# Patient Record
Sex: Male | Born: 1970 | Race: Black or African American | Hispanic: No | Marital: Single | Smoking: Current every day smoker
Health system: Southern US, Community
[De-identification: ages and names within clinical notes are randomized; demographics above are authoritative.]

## PROBLEM LIST (undated history)

## (undated) DIAGNOSIS — I10 Essential (primary) hypertension: Secondary | ICD-10-CM

---

## 2006-07-06 ENCOUNTER — Emergency Department: Payer: Self-pay | Admitting: General Practice

## 2007-02-22 ENCOUNTER — Emergency Department: Payer: Self-pay | Admitting: Emergency Medicine

## 2008-06-05 IMAGING — CR DG KNEE COMPLETE 4+V*R*
1 series · 4 of 4 positions shown · non-contrast
Comparison: none

REASON FOR EXAM: injury minor care 1
COMMENTS:   LMP: (Male)

PROCEDURE:     DXR - DXR KNEE RT COMP WITH OBLIQUES  - July 06, 2006 [DATE]
RESULT:     Four views show no fracture, dislocation or other acute bony
abnormality. The knee joint space is well maintained. The patella is intact.

[Series 1: view not recorded · 0.17mm/px · 4 of 4 slices shown]
[im 1/4]
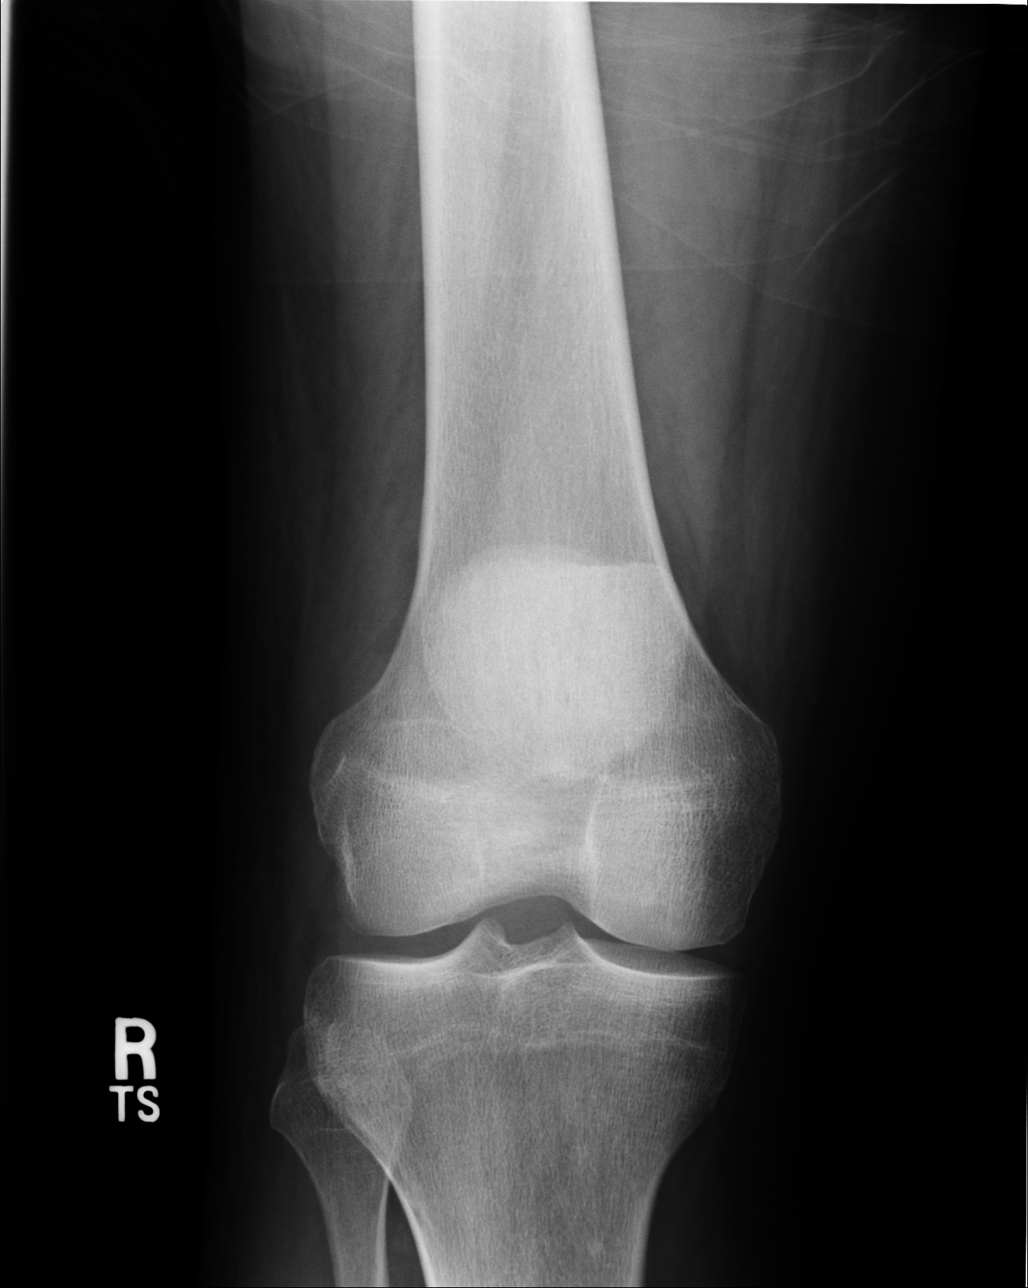
[im 2/4]
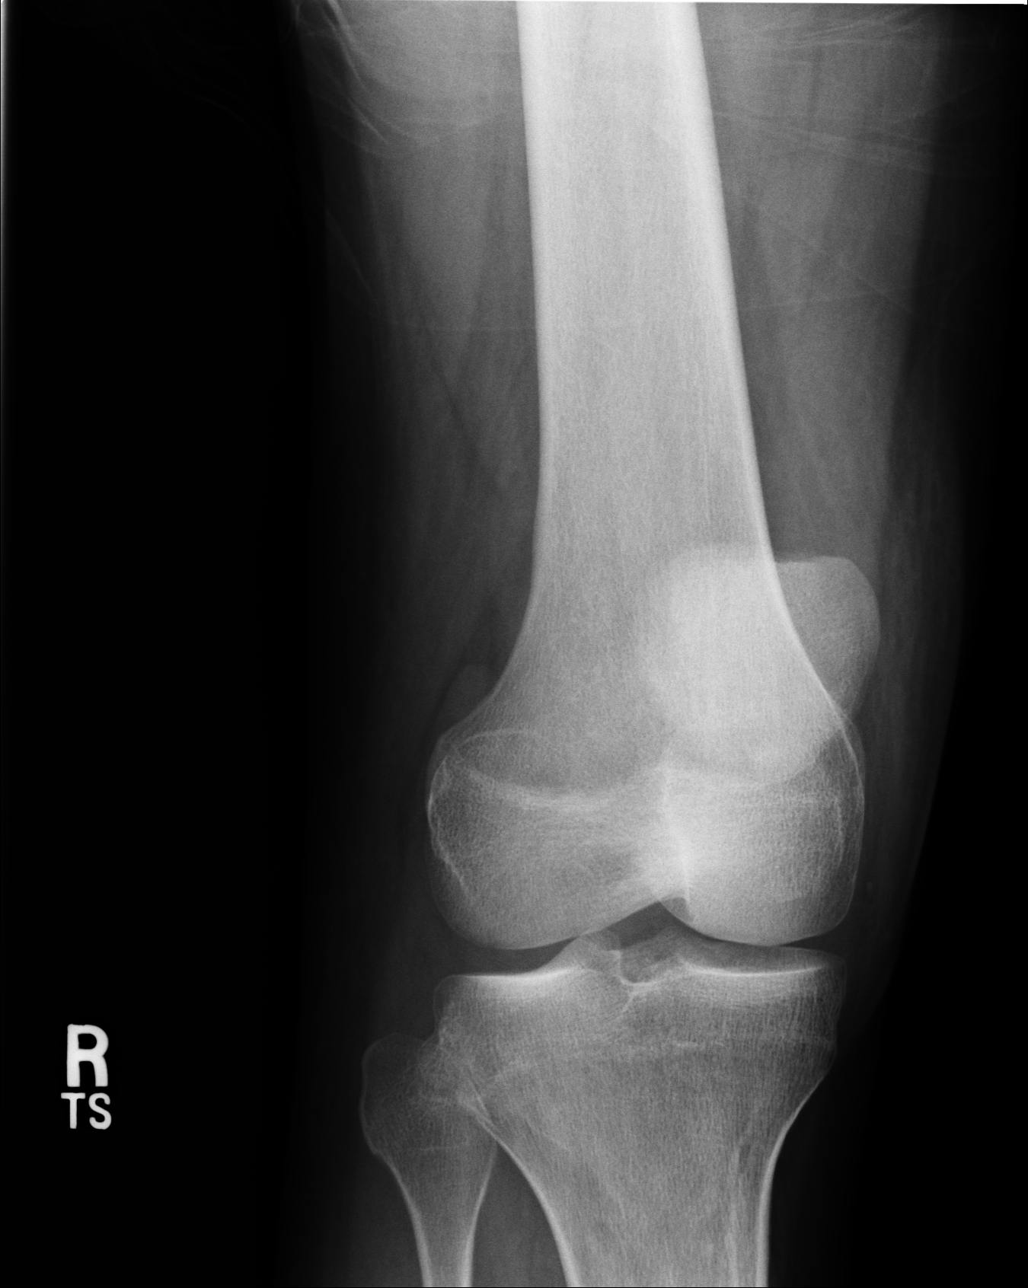
[im 3/4]
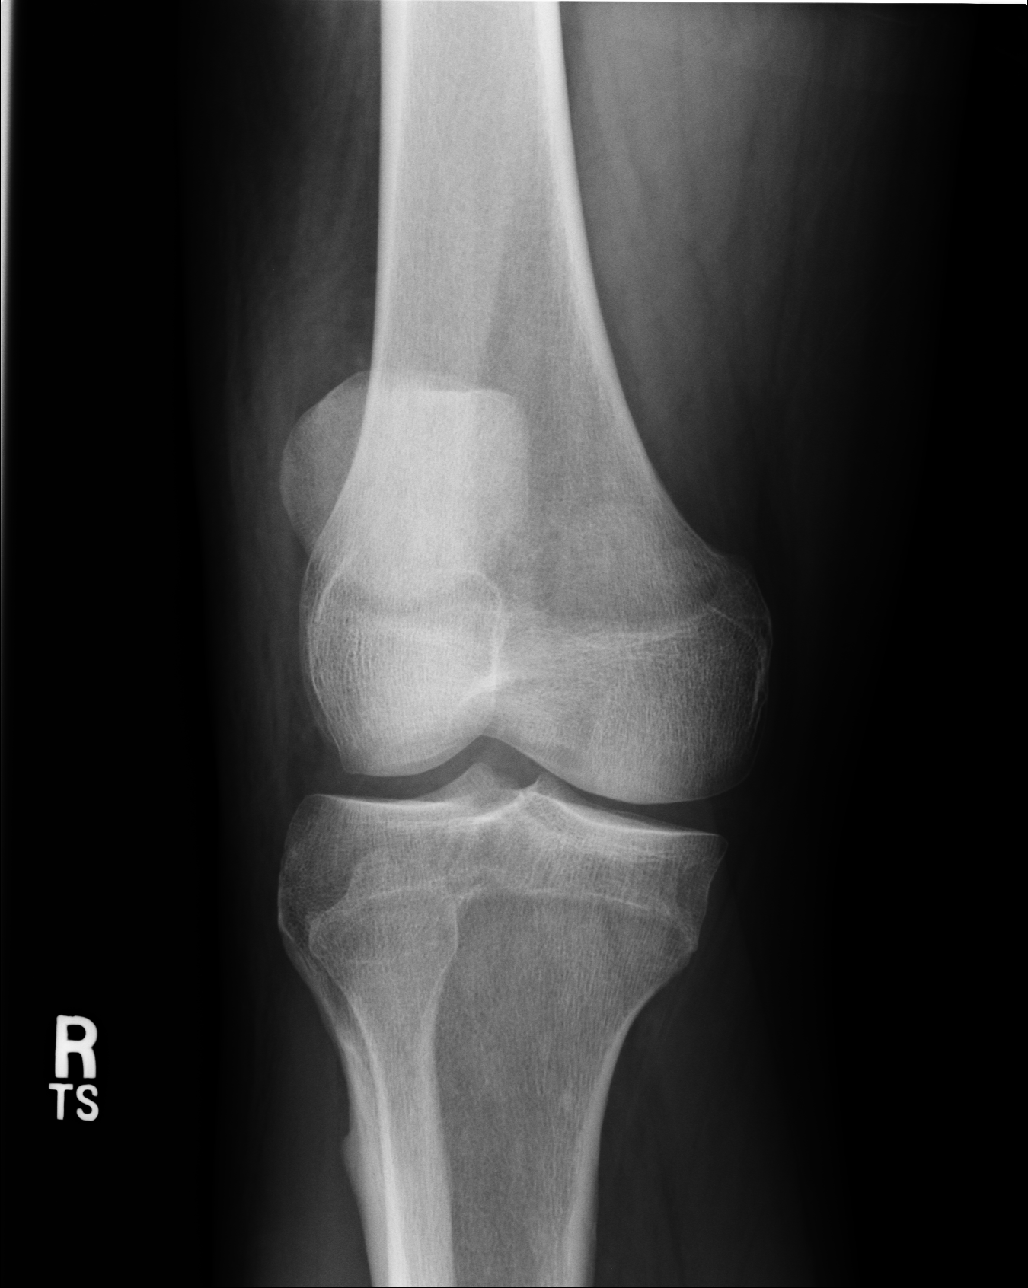
[im 4/4]
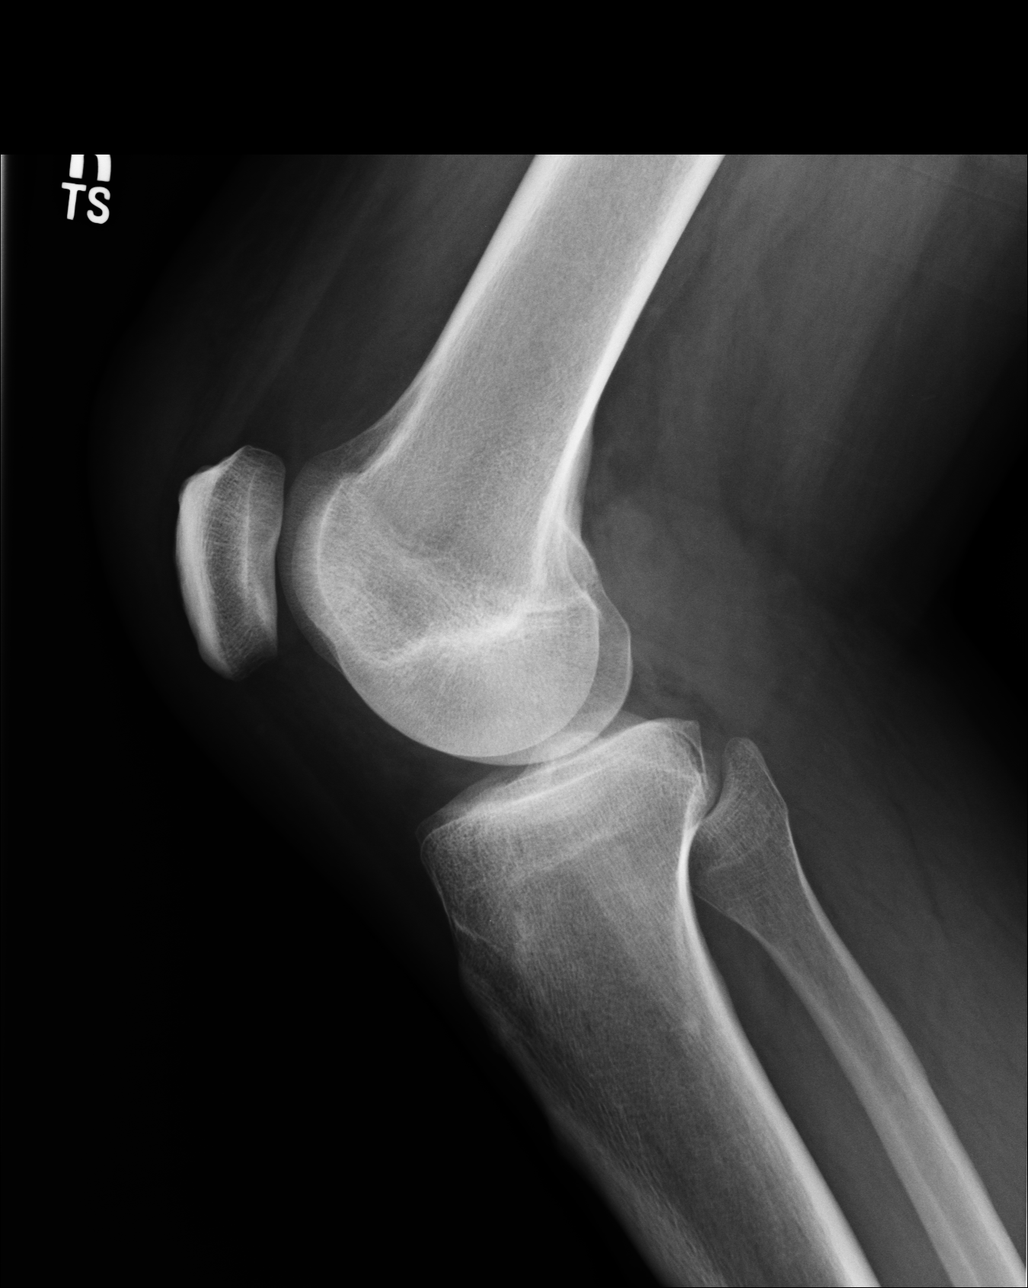

[4 of 4 positions shown; findings below may reference images not displayed]

IMPRESSION: No acute changes are identified.

## 2008-08-01 ENCOUNTER — Emergency Department: Payer: Self-pay | Admitting: Internal Medicine

## 2009-01-22 IMAGING — CR DG HUMERUS 2V *L*
1 series · 2 of 2 positions shown · non-contrast
Comparison: none

REASON FOR EXAM: RM  1    FELL
COMMENTS:

PROCEDURE:     DXR - DXR HUMERUS LEFT  - February 22, 2007 [DATE]
RESULT:     There is a mildly comminuted transverse fracture of the midshaft
of the LEFT humerus. The major distal fracture component is displaced
medially by approximately 7 mm.

[Series 1: view not recorded · 0.17mm/px · 2 of 2 slices shown]
[im 1/2]
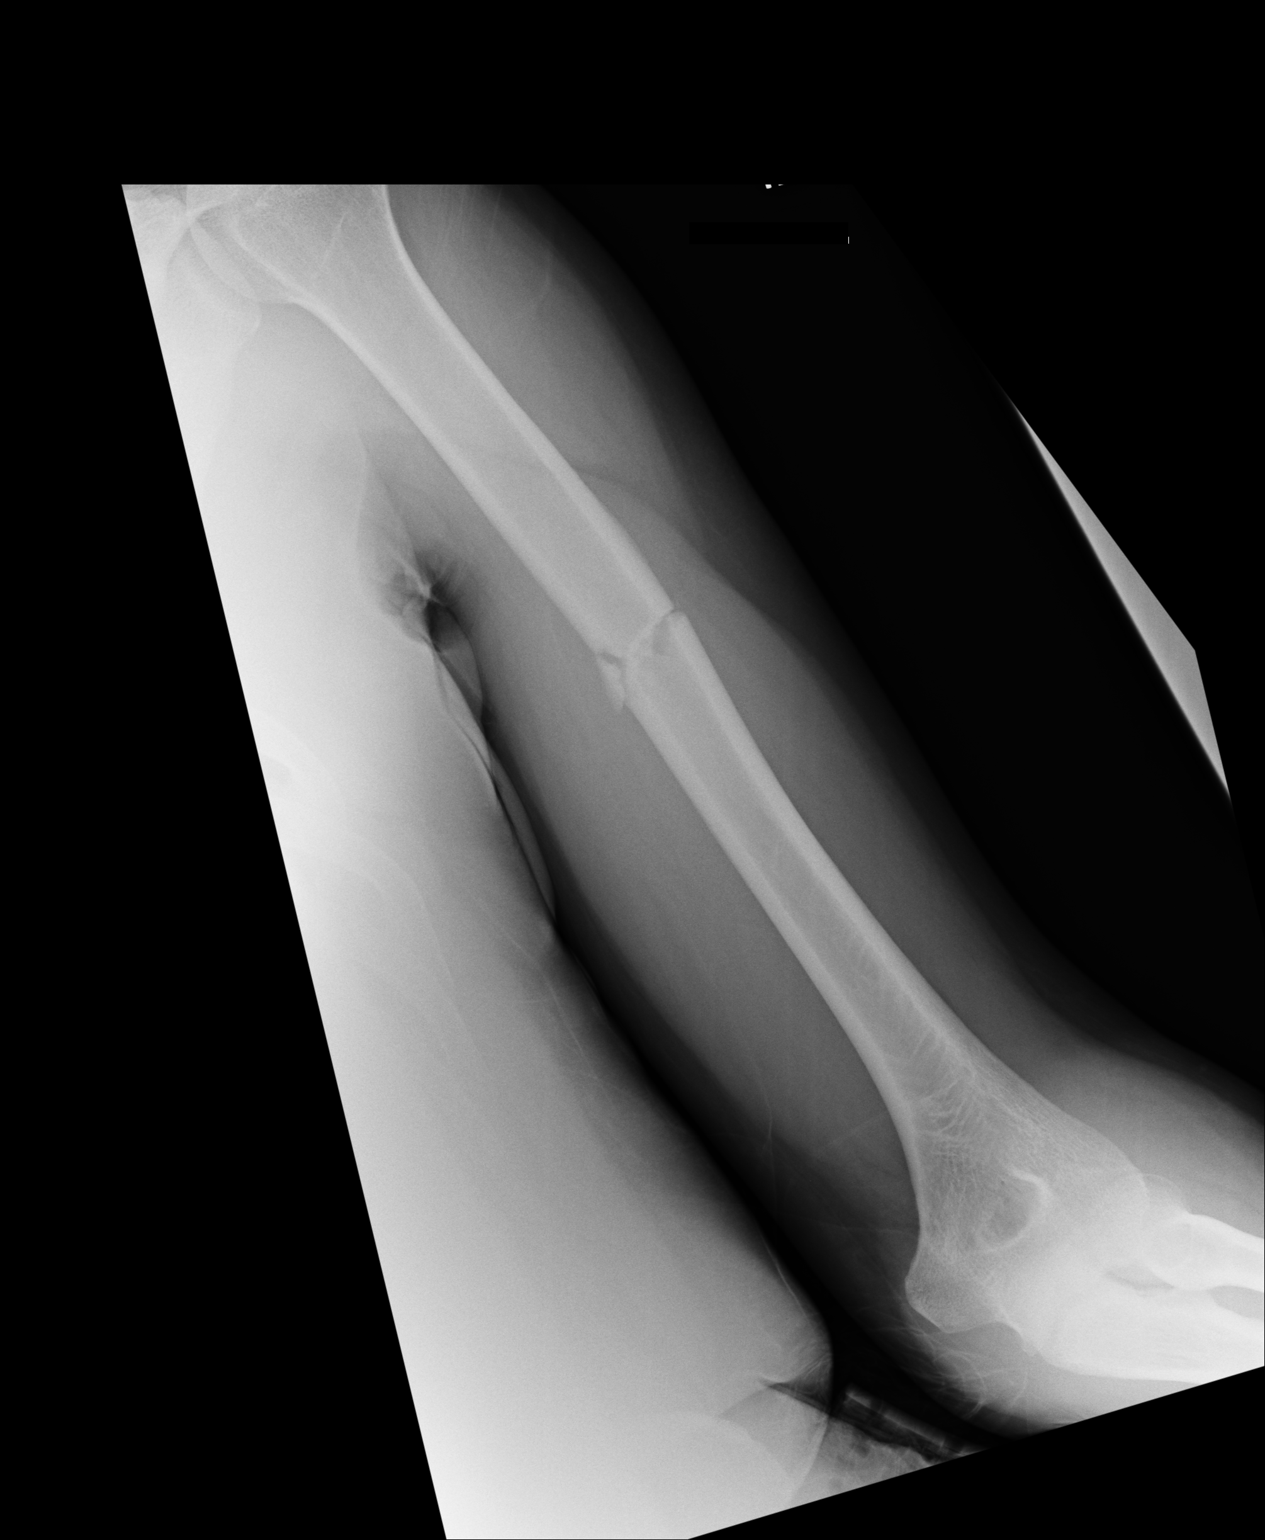
[im 2/2]
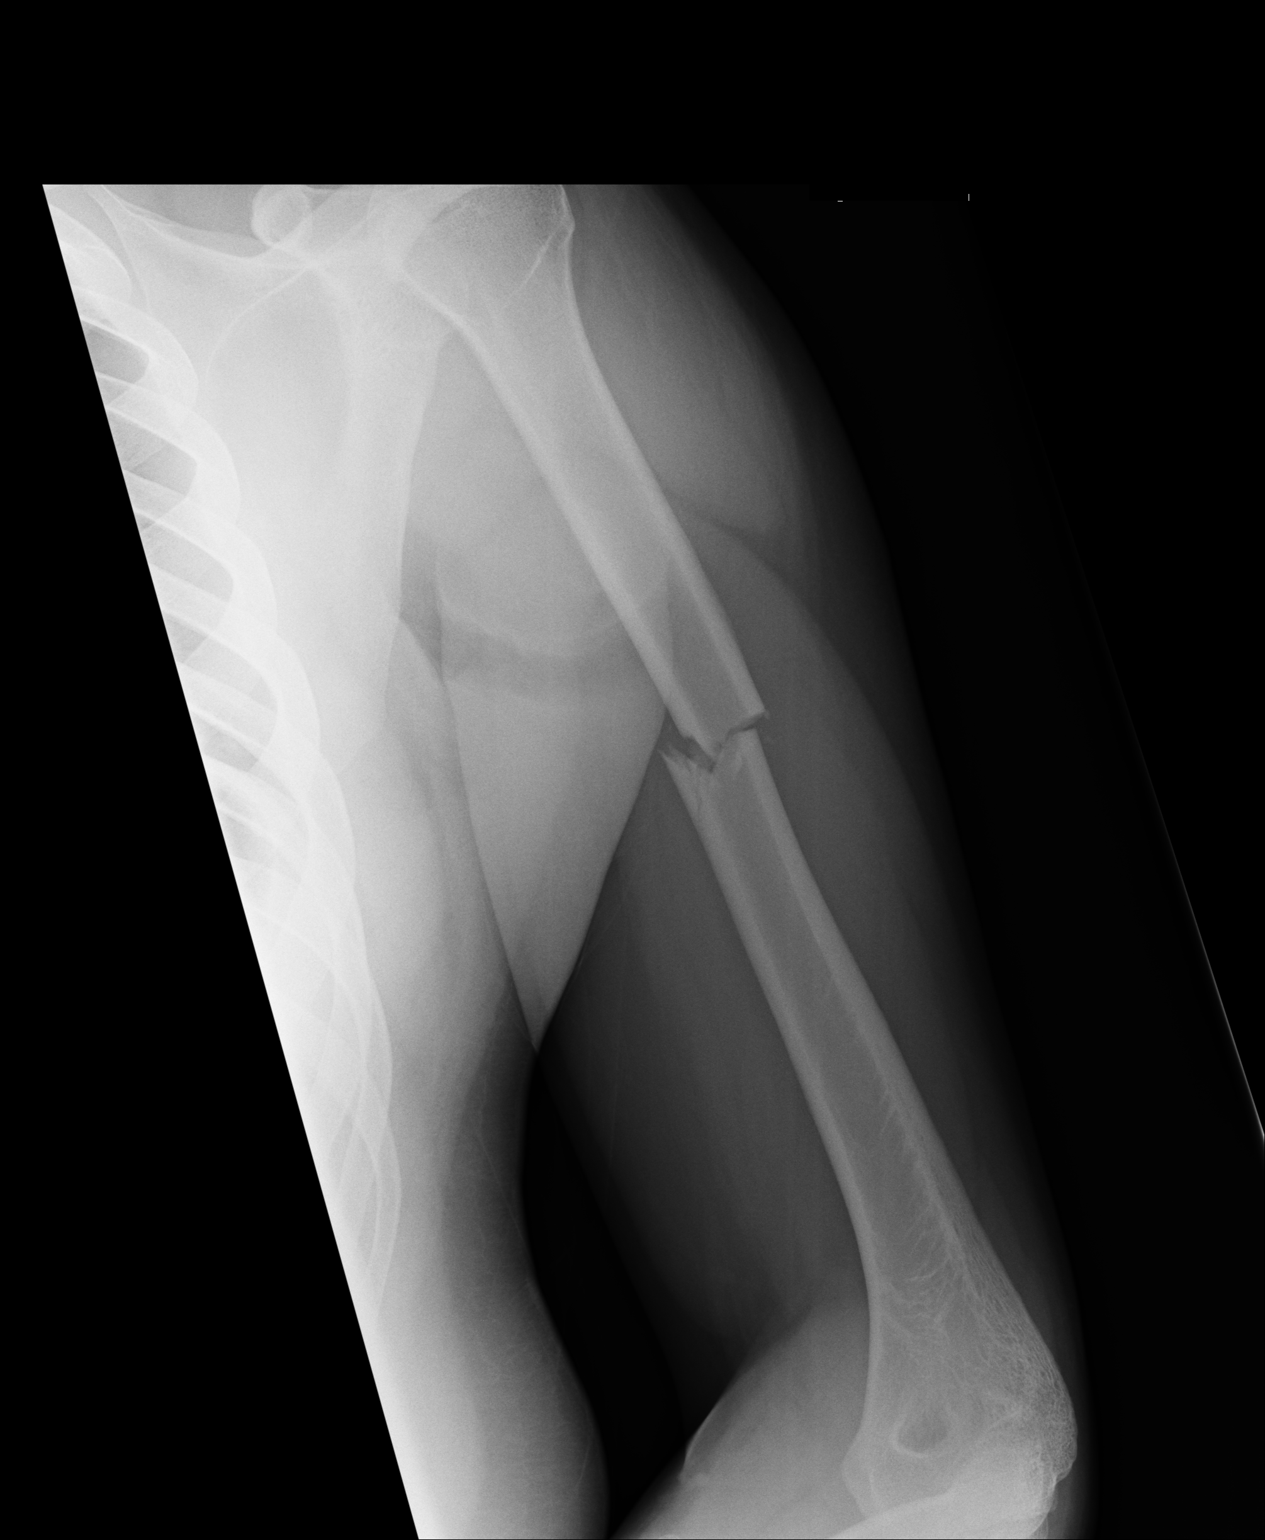

[2 of 2 positions shown; findings below may reference images not displayed]

IMPRESSION: Fracture of the midshaft of the LEFT humerus as noted above.

## 2009-01-28 ENCOUNTER — Emergency Department: Payer: Self-pay

## 2009-12-25 ENCOUNTER — Emergency Department: Payer: Self-pay | Admitting: Emergency Medicine

## 2010-02-12 ENCOUNTER — Emergency Department: Payer: Self-pay | Admitting: Unknown Physician Specialty

## 2018-09-07 ENCOUNTER — Other Ambulatory Visit: Payer: Self-pay

## 2018-09-07 DIAGNOSIS — Z20822 Contact with and (suspected) exposure to covid-19: Secondary | ICD-10-CM

## 2018-09-08 ENCOUNTER — Other Ambulatory Visit: Payer: Self-pay | Admitting: Family Medicine

## 2018-09-08 DIAGNOSIS — Z20822 Contact with and (suspected) exposure to covid-19: Secondary | ICD-10-CM

## 2018-09-08 LAB — NOVEL CORONAVIRUS, NAA: SARS-CoV-2, NAA: NOT DETECTED

## 2018-11-03 ENCOUNTER — Encounter: Payer: Self-pay | Admitting: Family Medicine

## 2018-11-22 ENCOUNTER — Encounter: Payer: Self-pay | Admitting: *Deleted

## 2020-01-22 ENCOUNTER — Encounter: Payer: Self-pay | Admitting: Family Medicine

## 2020-01-23 ENCOUNTER — Telehealth: Payer: Self-pay

## 2020-01-23 ENCOUNTER — Other Ambulatory Visit: Payer: Self-pay

## 2020-01-23 ENCOUNTER — Telehealth (INDEPENDENT_AMBULATORY_CARE_PROVIDER_SITE_OTHER): Payer: Self-pay | Admitting: Gastroenterology

## 2020-01-23 DIAGNOSIS — Z1211 Encounter for screening for malignant neoplasm of colon: Secondary | ICD-10-CM

## 2020-01-23 NOTE — Telephone Encounter (Signed)
Called patient to let him know that the colonoscopy date was entered wrong on his letter that the corrected date is Thursday Dec 30th, not 28th.  Thanks.  Marcelino Duster, CMA

## 2020-01-23 NOTE — Progress Notes (Signed)
Gastroenterology Pre-Procedure Review  Request Date: Thursday 01/31/21 Requesting Physician: Dr. Tobi Bastos  PATIENT REVIEW QUESTIONS: The patient responded to the following health history questions as indicated:    1. Are you having any GI issues? no 2. Do you have a personal history of Polyps? no 3. Do you have a family history of Colon Cancer or Polyps? no 4. Diabetes Mellitus? no 5. Joint replacements in the past 12 months?no 6. Major health problems in the past 3 months?no 7. Any artificial heart valves, MVP, or defibrillator?no    MEDICATIONS & ALLERGIES:    Patient reports the following regarding taking any anticoagulation/antiplatelet therapy:   Plavix, Coumadin, Eliquis, Xarelto, Lovenox, Pradaxa, Brilinta, or Effient? no Aspirin? no  Patient confirms/reports the following medications:  Current Outpatient Medications  Medication Sig Dispense Refill  . amLODipine (NORVASC) 10 MG tablet Take 10 mg by mouth daily.    . hydrochlorothiazide (HYDRODIURIL) 25 MG tablet Take 25 mg by mouth daily.    . metoprolol tartrate (LOPRESSOR) 100 MG tablet Take 100 mg by mouth 2 (two) times daily.     No current facility-administered medications for this visit.    Patient confirms/reports the following allergies:  No Known Allergies  No orders of the defined types were placed in this encounter.   AUTHORIZATION INFORMATION Primary Insurance: 1D#: Group #:  Secondary Insurance: 1D#: Group #:  SCHEDULE INFORMATION: Date: 02/01/20 Time: Location:[facility]

## 2020-01-30 ENCOUNTER — Other Ambulatory Visit
Admission: RE | Admit: 2020-01-30 | Discharge: 2020-01-30 | Disposition: A | Discharge status: Home / Self Care | Payer: BC Managed Care – PPO | Source: Ambulatory Visit | Attending: Gastroenterology | Admitting: Gastroenterology

## 2020-01-30 ENCOUNTER — Other Ambulatory Visit: Payer: Self-pay

## 2020-01-30 DIAGNOSIS — Z01812 Encounter for preprocedural laboratory examination: Secondary | ICD-10-CM | POA: Insufficient documentation

## 2020-01-30 DIAGNOSIS — Z20822 Contact with and (suspected) exposure to covid-19: Secondary | ICD-10-CM | POA: Diagnosis not present

## 2020-01-30 DIAGNOSIS — Z1211 Encounter for screening for malignant neoplasm of colon: Secondary | ICD-10-CM | POA: Diagnosis not present

## 2020-01-30 DIAGNOSIS — Z79899 Other long term (current) drug therapy: Secondary | ICD-10-CM | POA: Diagnosis not present

## 2020-01-30 DIAGNOSIS — F17200 Nicotine dependence, unspecified, uncomplicated: Secondary | ICD-10-CM | POA: Diagnosis not present

## 2020-01-30 DIAGNOSIS — K621 Rectal polyp: Secondary | ICD-10-CM | POA: Diagnosis not present

## 2020-01-30 DIAGNOSIS — K635 Polyp of colon: Secondary | ICD-10-CM | POA: Diagnosis not present

## 2020-01-31 ENCOUNTER — Encounter: Payer: Self-pay | Admitting: Gastroenterology

## 2020-01-31 LAB — SARS CORONAVIRUS 2 (TAT 6-24 HRS): SARS Coronavirus 2: NEGATIVE

## 2020-02-01 ENCOUNTER — Encounter: Payer: Self-pay | Admitting: Gastroenterology

## 2020-02-01 ENCOUNTER — Ambulatory Visit: Payer: BC Managed Care – PPO | Admitting: Anesthesiology

## 2020-02-01 ENCOUNTER — Other Ambulatory Visit: Payer: Self-pay

## 2020-02-01 ENCOUNTER — Ambulatory Visit
Admission: RE | Admit: 2020-02-01 | Discharge: 2020-02-01 | Disposition: A | Discharge status: Home / Self Care | Payer: BC Managed Care – PPO | Attending: Gastroenterology | Admitting: Gastroenterology

## 2020-02-01 ENCOUNTER — Encounter
Admission: RE | Disposition: A | Discharge status: Home / Self Care | Payer: Self-pay | Source: Home / Self Care | Attending: Gastroenterology

## 2020-02-01 DIAGNOSIS — F17200 Nicotine dependence, unspecified, uncomplicated: Secondary | ICD-10-CM | POA: Insufficient documentation

## 2020-02-01 DIAGNOSIS — Z1211 Encounter for screening for malignant neoplasm of colon: Secondary | ICD-10-CM | POA: Diagnosis not present

## 2020-02-01 DIAGNOSIS — K621 Rectal polyp: Secondary | ICD-10-CM | POA: Insufficient documentation

## 2020-02-01 DIAGNOSIS — Z79899 Other long term (current) drug therapy: Secondary | ICD-10-CM | POA: Insufficient documentation

## 2020-02-01 DIAGNOSIS — Z20822 Contact with and (suspected) exposure to covid-19: Secondary | ICD-10-CM | POA: Insufficient documentation

## 2020-02-01 DIAGNOSIS — K635 Polyp of colon: Secondary | ICD-10-CM

## 2020-02-01 HISTORY — DX: Essential (primary) hypertension: I10

## 2020-02-01 HISTORY — PX: COLONOSCOPY WITH PROPOFOL: SHX5780

## 2020-02-01 SURGERY — COLONOSCOPY WITH PROPOFOL
Anesthesia: General

## 2020-02-01 MED ORDER — LIDOCAINE HCL (PF) 2 % IJ SOLN
INTRAMUSCULAR | Status: AC
  Filled 2020-02-01: qty 5

## 2020-02-01 MED ORDER — PROPOFOL 500 MG/50ML IV EMUL
INTRAVENOUS | Status: DC | PRN
  Administered 2020-02-01: 100 ug via INTRAVENOUS
  Administered 2020-02-01: 144 ug/kg/min via INTRAVENOUS

## 2020-02-01 MED ORDER — PROPOFOL 500 MG/50ML IV EMUL
INTRAVENOUS | Status: AC
  Filled 2020-02-01: qty 50

## 2020-02-01 MED ORDER — PROPOFOL 10 MG/ML IV BOLUS
INTRAVENOUS | Status: AC
  Filled 2020-02-01: qty 20

## 2020-02-01 MED ORDER — SODIUM CHLORIDE 0.9 % IV SOLN: INTRAVENOUS | Status: DC

## 2020-02-01 MED ORDER — LIDOCAINE HCL (CARDIAC) PF 100 MG/5ML IV SOSY
PREFILLED_SYRINGE | INTRAVENOUS | Status: DC | PRN
  Administered 2020-02-01: 100 mg via INTRAVENOUS

## 2020-02-01 NOTE — Anesthesia Preprocedure Evaluation (Signed)
Anesthesia Evaluation  Patient identified by MRN, date of birth, ID band Patient awake    Reviewed: Allergy & Precautions, H&P , NPO status , Patient's Chart, lab work & pertinent test results, reviewed documented beta blocker date and time   Airway Mallampati: II   Neck ROM: full    Dental  (+) Teeth Intact   Pulmonary neg pulmonary ROS,    Pulmonary exam normal        Cardiovascular Exercise Tolerance: Poor hypertension, On Medications negative cardio ROS Normal cardiovascular exam Rhythm:regular Rate:Normal     Neuro/Psych negative neurological ROS  negative psych ROS   GI/Hepatic negative GI ROS, Neg liver ROS,   Endo/Other  negative endocrine ROS  Renal/GU negative Renal ROS  negative genitourinary   Musculoskeletal   Abdominal   Peds  Hematology negative hematology ROS (+)   Anesthesia Other Findings Past Medical History: No date: Hypertension No past surgical history on file.   Reproductive/Obstetrics negative OB ROS                             Anesthesia Physical Anesthesia Plan  ASA: II  Anesthesia Plan: General   Post-op Pain Management:    Induction:   PONV Risk Score and Plan:   Airway Management Planned:   Additional Equipment:   Intra-op Plan:   Post-operative Plan:   Informed Consent: I have reviewed the patients History and Physical, chart, labs and discussed the procedure including the risks, benefits and alternatives for the proposed anesthesia with the patient or authorized representative who has indicated his/her understanding and acceptance.     Dental Advisory Given  Plan Discussed with: CRNA  Anesthesia Plan Comments:         Anesthesia Quick Evaluation

## 2020-02-01 NOTE — H&P (Signed)
     [name], MD [address], [place], [place], [address] [address], [address], [place], [place], [address] Phone: [phone]  Fax: [phone]  Primary Care Physician:  Emogene Morgan, MD   Pre-Procedure History & Physical: HPI:  Victor Morse is a 49 y.o. male is here for an colonoscopy.   Past Medical History:  Diagnosis Date  . Hypertension     History reviewed. No pertinent surgical history.  Prior to Admission medications   Medication Sig Start Date End Date Taking? Authorizing Provider  amLODipine (NORVASC) 10 MG tablet Take 10 mg by mouth daily. 01/01/20  Yes [provider]  hydrochlorothiazide (HYDRODIURIL) 25 MG tablet Take 25 mg by mouth daily. 01/01/20  Yes [provider]  metoprolol tartrate (LOPRESSOR) 100 MG tablet Take 100 mg by mouth 2 (two) times daily. 12/04/19  Yes [provider]    Allergies as of 01/23/2020  . (No Known Allergies)    History reviewed. No pertinent family history.  Social History   Socioeconomic History  . Marital status: Single    Spouse name: Not on file  . Number of children: Not on file  . Years of education: Not on file  . Highest education level: Not on file  Occupational History  . Not on file  Tobacco Use  . Smoking status: Current Every Day Smoker  . Smokeless tobacco: Never Used  Vaping Use  . Vaping Use: Never used  Substance and Sexual Activity  . Alcohol use: Not on file    Comment: occassionally, mostly weekend  . Drug use: Never  . Sexual activity: Not on file  Other Topics Concern  . Not on file  Social History Narrative  . Not on file   Social Determinants of Health   Financial Resource Strain: Not on file  Food Insecurity: Not on file  Transportation Needs: Not on file  Physical Activity: Not on file  Stress: Not on file  Social Connections: Not on file  Intimate Partner Violence: Not on file    Review of Systems: See HPI, otherwise  negative ROS  Physical Exam: BP (!) 153/103   Pulse 89   Temp 97.6 F (36.4 C) (Temporal)   Resp 17   Ht 6\' 2"  (1.88 m)   Wt 92.5 kg   SpO2 100%   BMI 26.19 kg/m  General:   Alert,  pleasant and cooperative in NAD Head:  Normocephalic and atraumatic. Neck:  Supple; no masses or thyromegaly. Lungs:  Clear throughout to auscultation, normal respiratory effort.    Heart:  +S1, +S2, Regular rate and rhythm, No edema. Abdomen:  Soft, nontender and nondistended. Normal bowel sounds, without guarding, and without rebound.   Neurologic:  Alert and  oriented x4;  grossly normal neurologically.  Impression/Plan: [name] is here for an colonoscopy to be performed for Screening colonoscopy average risk   Risks, benefits, limitations, and alternatives regarding  colonoscopy have been reviewed with the patient.  Questions have been answered.  All parties agreeable.   Hetty Blend, MD  02/01/2020, 8:07 AM

## 2020-02-01 NOTE — Transfer of Care (Signed)
Immediate Anesthesia Transfer of Care Note  Patient: Victor Morse  Procedure(s) Performed: COLONOSCOPY WITH PROPOFOL (N/A )  Patient Location: PACU and Endoscopy Unit  Anesthesia Type:General  Level of Consciousness: awake  Airway & Oxygen Therapy: Patient Spontanous Breathing and Patient connected to nasal cannula oxygen  Post-op Assessment: Report given to RN and Post -op Vital signs reviewed and stable  Post vital signs: Reviewed and stable  Last Vitals:  Vitals Value Taken Time  BP 127/94 02/01/20 0831  Temp    Pulse 85 02/01/20 0831  Resp 15 02/01/20 0831  SpO2 100 % 02/01/20 0831  Vitals shown include unvalidated device data.  Last Pain:  Vitals:   02/01/20 0726  TempSrc: Temporal  PainSc: 0-No pain         Complications: No complications documented.

## 2020-02-01 NOTE — Op Note (Signed)
[facility] Gastroenterology Patient Name: Victor Morse Procedure Date: 02/01/2020 7:04 AM MRN: [id number] Account #: [id number] Date of Birth: [birth year] Admit Type: Outpatient Age: 49 Room: [facility] ENDO ROOM 1 Gender: Male Note Status: Finalized Procedure:             Colonoscopy Indications:           Screening for colorectal malignant neoplasm Providers:             [name] MD, MD Referring MD:          Sylvie Farrier. Aycock MD (Referring MD) Medicines:             Monitored Anesthesia Care Complications:         No immediate complications. Procedure:             Pre-Anesthesia Assessment:                        - Prior to the procedure, a History and Physical was                         performed, and patient medications, allergies and                         sensitivities were reviewed. The patient's tolerance                         of previous anesthesia was reviewed.                        - The risks and benefits of the procedure and the                         sedation options and risks were discussed with the                         patient. All questions were answered and informed                         consent was obtained.                        - ASA Grade Assessment: II - A patient with mild                         systemic disease.                        After obtaining informed consent, the colonoscope was                         passed under direct vision. Throughout the procedure,                         the patient's blood pressure, pulse, and oxygen                         saturations were monitored continuously. The                         Colonoscope was introduced through the anus  and                         advanced to the the cecum, identified by the                         appendiceal orifice. The colonoscopy was performed                         with ease. The patient tolerated the procedure well.                         The quality of  the bowel preparation was excellent. Findings:      The perianal and digital rectal examinations were normal.      Two sessile polyps were found in the ascending colon and cecum. The       polyps were 2 to 3 mm in size. These polyps were removed with a cold       biopsy forceps. Resection and retrieval were complete.      Two sessile polyps were found in the rectum and sigmoid colon. The       polyps were 4 to 5 mm in size. These polyps were removed with a cold       snare. Resection and retrieval were complete.      The exam was otherwise without abnormality on direct and retroflexion       views. Impression:            - Two 2 to 3 mm polyps in the ascending colon and in                         the cecum, removed with a cold biopsy forceps.                         Resected and retrieved.                        - Two 4 to 5 mm polyps in the rectum and in the                         sigmoid colon, removed with a cold snare. Resected and                         retrieved.                        - The examination was otherwise normal on direct and                         retroflexion views. Recommendation:        - Discharge patient to home (with escort).                        - Resume previous diet.                        - Continue present medications.                        - Await pathology results.                        -  Repeat colonoscopy for surveillance based on                         pathology results. Procedure Code(s):     --- Professional ---                        [phone], Colonoscopy, flexible; with removal of                         tumor(s), polyp(s), or other lesion(s) by snare                         technique                        [id number], 59, Colonoscopy, flexible; with biopsy, single                         or multiple Diagnosis Code(s):     --- Professional ---                        Z12.11, Encounter for screening for malignant neoplasm                          of colon                        K62.1, Rectal polyp                        K63.5, Polyp of colon CPT copyright 2019 [employer]. All rights reserved. The codes documented in this report are preliminary and upon coder review may  be revised to meet current compliance requirements. [name], MD [name] MD, MD 02/01/2020 8:27:24 AM This report has been signed electronically. Number of Addenda: 0 Note Initiated On: 02/01/2020 7:04 AM Scope Withdrawal Time: 0 hours 12 minutes 3 seconds  Total Procedure Duration: 0 hours 13 minutes 43 seconds  Estimated Blood Loss:  Estimated blood loss: none.      [facility]

## 2020-02-06 ENCOUNTER — Encounter: Payer: Self-pay | Admitting: Gastroenterology

## 2020-02-06 LAB — SURGICAL PATHOLOGY

## 2020-02-08 NOTE — Anesthesia Postprocedure Evaluation (Signed)
Anesthesia Post Note  Patient: Victor Morse  Procedure(s) Performed: COLONOSCOPY WITH PROPOFOL (N/A )  Patient location during evaluation: PACU Anesthesia Type: General Level of consciousness: awake and alert Pain management: pain level controlled Vital Signs Assessment: post-procedure vital signs reviewed and stable Respiratory status: spontaneous breathing, nonlabored ventilation, respiratory function stable and patient connected to nasal cannula oxygen Cardiovascular status: blood pressure returned to baseline and stable Postop Assessment: no apparent nausea or vomiting Anesthetic complications: no   No complications documented.   Last Vitals:  Vitals:   02/01/20 0850 02/01/20 0900  BP: 132/82 131/88  Pulse: 69 60  Resp: 14 17  Temp:    SpO2: 100% 100%    Last Pain:  Vitals:   02/01/20 0900  TempSrc:   PainSc: 0-No pain                 Yevette Edwards

## 2020-05-10 ENCOUNTER — Other Ambulatory Visit: Payer: Self-pay | Admitting: Family Medicine

## 2020-05-11 LAB — CMP12+LP+TP+TSH+6AC+PSA+CBC…
ALT: 20 IU/L (ref 0–44)
AST: 19 IU/L (ref 0–40)
Albumin/Globulin Ratio: 1.7 (ref 1.2–2.2)
Albumin: 4.4 g/dL (ref 3.8–4.9)
Alkaline Phosphatase: 90 IU/L (ref 44–121)
BUN/Creatinine Ratio: 12 (ref 9–20)
BUN: 9 mg/dL (ref 6–24)
Basophils Absolute: 0 10*3/uL (ref 0.0–0.2)
Basos: 1 %
Bilirubin Total: 0.3 mg/dL (ref 0.0–1.2)
Calcium: 9.3 mg/dL (ref 8.7–10.2)
Chloride: 103 mmol/L (ref 96–106)
Chol/HDL Ratio: 3.9 ratio (ref 0.0–5.0)
Cholesterol, Total: 192 mg/dL (ref 100–199)
Creatinine, Ser: 0.78 mg/dL (ref 0.76–1.27)
EOS (ABSOLUTE): 0.2 10*3/uL (ref 0.0–0.4)
Eos: 3 %
Estimated CHD Risk: 0.7 times avg. (ref 0.0–1.0)
Free Thyroxine Index: 1.8 (ref 1.2–4.9)
GGT: 23 IU/L (ref 0–65)
Globulin, Total: 2.6 g/dL (ref 1.5–4.5)
Glucose: 88 mg/dL (ref 65–99)
HDL: 49 mg/dL (ref >39)
Hematocrit: 43.6 % (ref 37.5–51.0)
Hemoglobin: 15.1 g/dL (ref 13.0–17.7)
Immature Grans (Abs): 0 10*3/uL (ref 0.0–0.1)
Immature Granulocytes: 0 %
Iron: 73 ug/dL (ref 38–169)
LDH: 193 IU/L (ref 121–224)
LDL Chol Calc (NIH): 110 mg/dL — ABNORMAL HIGH (ref 0–99)
Lymphocytes Absolute: 2.4 10*3/uL (ref 0.7–3.1)
Lymphs: 37 %
MCH: 32.8 pg (ref 26.6–33.0)
MCHC: 34.6 g/dL (ref 31.5–35.7)
MCV: 95 fL (ref 79–97)
Monocytes Absolute: 0.7 10*3/uL (ref 0.1–0.9)
Monocytes: 11 %
Neutrophils Absolute: 3 10*3/uL (ref 1.4–7.0)
Neutrophils: 48 %
Phosphorus: 4.1 mg/dL (ref 2.8–4.1)
Platelets: 189 10*3/uL (ref 150–450)
Potassium: 3.5 mmol/L (ref 3.5–5.2)
Prostate Specific Ag, Serum: 1 ng/mL (ref 0.0–4.0)
RBC: 4.6 x10E6/uL (ref 4.14–5.80)
RDW: 12.1 % (ref 11.6–15.4)
Sodium: 141 mmol/L (ref 134–144)
T3 Uptake Ratio: 28 % (ref 24–39)
T4, Total: 6.6 ug/dL (ref 4.5–12.0)
TSH: 1.71 u[IU]/mL (ref 0.450–4.500)
Total Protein: 7 g/dL (ref 6.0–8.5)
Triglycerides: 190 mg/dL — ABNORMAL HIGH (ref 0–149)
Uric Acid: 8.4 mg/dL (ref 3.8–8.4)
VLDL Cholesterol Cal: 33 mg/dL (ref 5–40)
WBC: 6.3 10*3/uL (ref 3.4–10.8)
eGFR: 107 mL/min/{1.73_m2} (ref >59)

## 2023-08-19 ENCOUNTER — Other Ambulatory Visit: Payer: Self-pay | Admitting: Family Medicine

## 2023-08-19 DIAGNOSIS — Z87891 Personal history of nicotine dependence: Secondary | ICD-10-CM
